# Patient Record
Sex: Male | Born: 1975 | Race: Black or African American | Hispanic: No | Marital: Single | Smoking: Never smoker
Health system: Southern US, Community
[De-identification: ages and names within clinical notes are randomized; demographics above are authoritative.]

---

## 2007-02-03 ENCOUNTER — Emergency Department (HOSPITAL_COMMUNITY): Admission: EM | Admit: 2007-02-03 | Discharge: 2007-02-03 | Payer: Self-pay | Admitting: Emergency Medicine

## 2008-04-25 ENCOUNTER — Emergency Department (HOSPITAL_COMMUNITY): Admission: EM | Admit: 2008-04-25 | Discharge: 2008-04-25 | Payer: Self-pay | Admitting: Family Medicine

## 2009-03-05 ENCOUNTER — Emergency Department (HOSPITAL_COMMUNITY): Admission: EM | Admit: 2009-03-05 | Discharge: 2009-03-05 | Payer: Self-pay | Admitting: Emergency Medicine

## 2009-03-05 ENCOUNTER — Emergency Department (HOSPITAL_COMMUNITY): Admission: EM | Admit: 2009-03-05 | Discharge: 2009-03-05 | Payer: Self-pay | Admitting: Family Medicine

## 2018-11-23 ENCOUNTER — Encounter (HOSPITAL_COMMUNITY): Payer: Self-pay | Admitting: Emergency Medicine

## 2018-11-23 ENCOUNTER — Other Ambulatory Visit: Payer: Self-pay

## 2018-11-23 ENCOUNTER — Emergency Department (HOSPITAL_COMMUNITY)
Admission: EM | Admit: 2018-11-23 | Discharge: 2018-11-23 | Disposition: A | Discharge status: Home / Self Care | Payer: Self-pay | Attending: Emergency Medicine | Admitting: Emergency Medicine

## 2018-11-23 DIAGNOSIS — R1110 Vomiting, unspecified: Secondary | ICD-10-CM | POA: Insufficient documentation

## 2018-11-23 DIAGNOSIS — G43809 Other migraine, not intractable, without status migrainosus: Secondary | ICD-10-CM | POA: Insufficient documentation

## 2018-11-23 LAB — URINALYSIS, ROUTINE W REFLEX MICROSCOPIC
BILIRUBIN URINE: NEGATIVE
Glucose, UA: NEGATIVE mg/dL
Hgb urine dipstick: NEGATIVE
Ketones, ur: NEGATIVE mg/dL
Leukocytes,Ua: NEGATIVE
NITRITE: NEGATIVE
Protein, ur: NEGATIVE mg/dL
SPECIFIC GRAVITY, URINE: 1.005 (ref 1.005–1.030)
pH: 6 (ref 5.0–8.0)

## 2018-11-23 LAB — COMPREHENSIVE METABOLIC PANEL
ALK PHOS: 52 U/L (ref 38–126)
ALT: 16 U/L (ref 0–44)
AST: 19 U/L (ref 15–41)
Albumin: 3.9 g/dL (ref 3.5–5.0)
Anion gap: 6 (ref 5–15)
BUN: 8 mg/dL (ref 6–20)
CO2: 26 mmol/L (ref 22–32)
Calcium: 8.7 mg/dL — ABNORMAL LOW (ref 8.9–10.3)
Chloride: 106 mmol/L (ref 98–111)
Creatinine, Ser: 0.98 mg/dL (ref 0.61–1.24)
GFR calc Af Amer: >60 mL/min (ref >60)
Glucose, Bld: 91 mg/dL (ref 70–99)
POTASSIUM: 3.6 mmol/L (ref 3.5–5.1)
Sodium: 138 mmol/L (ref 135–145)
TOTAL PROTEIN: 6.4 g/dL — AB (ref 6.5–8.1)
Total Bilirubin: 0.6 mg/dL (ref 0.3–1.2)

## 2018-11-23 LAB — CBC
HCT: 42 % (ref 39.0–52.0)
HEMOGLOBIN: 13.3 g/dL (ref 13.0–17.0)
MCH: 28.1 pg (ref 26.0–34.0)
MCHC: 31.7 g/dL (ref 30.0–36.0)
MCV: 88.8 fL (ref 80.0–100.0)
Platelets: 196 10*3/uL (ref 150–400)
RBC: 4.73 MIL/uL (ref 4.22–5.81)
RDW: 12.8 % (ref 11.5–15.5)
WBC: 2.7 10*3/uL — AB (ref 4.0–10.5)
nRBC: 0 % (ref 0.0–0.2)

## 2018-11-23 LAB — LIPASE, BLOOD: LIPASE: 60 U/L — AB (ref 11–51)

## 2018-11-23 MED ORDER — SODIUM CHLORIDE 0.9% FLUSH: 3.0000 mL | Freq: Once | INTRAVENOUS | Status: DC

## 2018-11-23 MED ORDER — KETOROLAC TROMETHAMINE 30 MG/ML IJ SOLN
30.0000 mg | Freq: Once | INTRAMUSCULAR | Status: DC
  Filled 2018-11-23: qty 1

## 2018-11-23 MED ORDER — SODIUM CHLORIDE 0.9 % IV BOLUS (SEPSIS): 1000.0000 mL | Freq: Once | INTRAVENOUS | Status: DC

## 2018-11-23 MED ORDER — ONDANSETRON 8 MG PO TBDP: 8.0000 mg | ORAL_TABLET | Freq: Three times a day (TID) | ORAL | 0 refills | Status: DC | PRN

## 2018-11-23 MED ORDER — NAPROXEN 500 MG PO TABS: 500.0000 mg | ORAL_TABLET | Freq: Two times a day (BID) | ORAL | 0 refills | Status: DC

## 2018-11-23 MED ORDER — SODIUM CHLORIDE 0.9 % IV SOLN: 1000.0000 mL | INTRAVENOUS | Status: DC

## 2018-11-23 MED ORDER — PROCHLORPERAZINE EDISYLATE 10 MG/2ML IJ SOLN
10.0000 mg | Freq: Once | INTRAMUSCULAR | Status: DC
  Filled 2018-11-23: qty 2

## 2018-11-23 NOTE — ED Triage Notes (Signed)
Pt reports headache and vomiting x 1 today. NAD noted. Pt asking for doctors note in triage.

## 2018-11-23 NOTE — ED Notes (Signed)
Patient refused IV fluids/meds. 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

## 2018-11-23 NOTE — ED Provider Notes (Signed)
[facility] EMERGENCY DEPARTMENT Provider Note   CSN: [id number] Arrival date & time: 11/23/18  1659    History   Chief Complaint Chief Complaint  Patient presents with  . Headache  . Emesis    HPI Stephen Harrison is a 43 y.o. male.     HPI Pt started having trouble with vomiting yesterday.  He vomited a few times since yesterday.  He also developed a diffuse headache.  No neck pain.  No sudden onset.  No fever.  No trouble with vision or speech.  No abdominal pain. Pt tried some ibuprofen without relief.  History reviewed. No pertinent past medical history.  There are no active problems to display for this patient.   History reviewed. No pertinent surgical history.      Home Medications    Prior to Admission medications   Medication Sig Start Date End Date Taking? Authorizing Provider  naproxen (NAPROSYN) 500 MG tablet Take 1 tablet (500 mg total) by mouth 2 (two) times daily. 11/23/18   Linwood Dibbles, MD  ondansetron (ZOFRAN ODT) 8 MG disintegrating tablet Take 1 tablet (8 mg total) by mouth every 8 (eight) hours as needed for nausea or vomiting. 11/23/18   Linwood Dibbles, MD    Family History No family history on file.  Social History Social History   Tobacco Use  . Smoking status: Never Smoker  . Smokeless tobacco: Never Used  Substance Use Topics  . Alcohol use: Never    Frequency: Never  . Drug use: Never     Allergies   Patient has no known allergies.   Review of Systems Review of Systems  All other systems reviewed and are negative.    Physical Exam Updated Vital Signs BP (!) 133/97 (BP Location: Right Arm)   Pulse 66   Temp 98.2 F (36.8 C) (Oral)   Resp 16   SpO2 98%   Physical Exam Vitals signs and nursing note reviewed.  Constitutional:      General: He is not in acute distress.    Appearance: He is well-developed.  HENT:     Head: Normocephalic and atraumatic.     Right Ear: External ear normal.     Left Ear:  External ear normal.  Eyes:     General: No scleral icterus.       Right eye: No discharge.        Left eye: No discharge.     Conjunctiva/sclera: Conjunctivae normal.  Neck:     Musculoskeletal: Normal range of motion and neck supple. No neck rigidity.     Trachea: No tracheal deviation.  Cardiovascular:     Rate and Rhythm: Normal rate and regular rhythm.  Pulmonary:     Effort: Pulmonary effort is normal. No respiratory distress.     Breath sounds: Normal breath sounds. No stridor. No wheezing or rales.  Abdominal:     General: Bowel sounds are normal. There is no distension.     Palpations: Abdomen is soft.     Tenderness: There is no abdominal tenderness. There is no guarding or rebound.  Musculoskeletal:        General: No tenderness.  Skin:    General: Skin is warm and dry.     Findings: No rash.  Neurological:     Mental Status: He is alert.     Cranial Nerves: No cranial nerve deficit (no facial droop, extraocular movements intact, no slurred speech).     Sensory: No sensory deficit.  Motor: No abnormal muscle tone or seizure activity.     Coordination: Coordination normal.      ED Treatments / Results  Labs (all labs ordered are listed, but only abnormal results are displayed) Labs Reviewed  LIPASE, BLOOD - Abnormal; Notable for the following components:      Result Value   Lipase 60 (*)    All other components within normal limits  COMPREHENSIVE METABOLIC PANEL - Abnormal; Notable for the following components:   Calcium 8.7 (*)    Total Protein 6.4 (*)    All other components within normal limits  CBC - Abnormal; Notable for the following components:   WBC 2.7 (*)    All other components within normal limits  URINALYSIS, ROUTINE W REFLEX MICROSCOPIC - Abnormal; Notable for the following components:   Color, Urine STRAW (*)    All other components within normal limits    EKG None  Radiology No results found.  Procedures Procedures (including  critical care time)  Medications Ordered in ED Medications  sodium chloride flush (NS) 0.9 % injection 3 mL (3 mLs Intravenous Refused 11/23/18 2040)  sodium chloride 0.9 % bolus 1,000 mL (1,000 mLs Intravenous Refused 11/23/18 2040)    Followed by  0.9 %  sodium chloride infusion (1,000 mLs Intravenous Refused 11/23/18 2041)  prochlorperazine (COMPAZINE) injection 10 mg (10 mg Intravenous Refused 11/23/18 2041)  ketorolac (TORADOL) 30 MG/ML injection 30 mg (30 mg Intravenous Refused 11/23/18 2041)     Initial Impression / Assessment and Plan / ED Course  I have reviewed the triage vital signs and the nursing notes.  Pertinent labs & imaging results that were available during my care of the patient were reviewed by me and considered in my medical decision making (see chart for details).   Patient describes gradual onset of headache associated with nausea and vomiting.  He appears well nontoxic.  No neck stiffness.  Symptoms are suggestive of a migraine headache.  I offered IV fluids and pain medications.  Patient was anxious to go home and did not want any treatment.  Plan on discharge home with prescription for Naprosyn and Zofran.  Final Clinical Impressions(s) / ED Diagnoses   Final diagnoses:  Other migraine without status migrainosus, not intractable    ED Discharge Orders         Ordered    naproxen (NAPROSYN) 500 MG tablet  2 times daily     11/23/18 2046    ondansetron (ZOFRAN ODT) 8 MG disintegrating tablet  Every 8 hours PRN     11/23/18 2046           Linwood Dibbles, MD 11/23/18 2048

## 2018-11-23 NOTE — Discharge Instructions (Addendum)
Take the medications as prescribed, follow-up with the primary care doctor if not improving, return as needed for worsening symptoms

## 2018-11-28 ENCOUNTER — Emergency Department (HOSPITAL_COMMUNITY): Payer: No Typology Code available for payment source

## 2018-11-28 ENCOUNTER — Emergency Department (HOSPITAL_COMMUNITY)
Admission: EM | Admit: 2018-11-28 | Discharge: 2018-11-28 | Disposition: A | Discharge status: Home / Self Care | Payer: No Typology Code available for payment source | Attending: Emergency Medicine | Admitting: Emergency Medicine

## 2018-11-28 ENCOUNTER — Other Ambulatory Visit: Payer: Self-pay

## 2018-11-28 ENCOUNTER — Encounter (HOSPITAL_COMMUNITY): Payer: Self-pay

## 2018-11-28 DIAGNOSIS — S8991XA Unspecified injury of right lower leg, initial encounter: Secondary | ICD-10-CM | POA: Diagnosis present

## 2018-11-28 DIAGNOSIS — Y92410 Unspecified street and highway as the place of occurrence of the external cause: Secondary | ICD-10-CM | POA: Diagnosis not present

## 2018-11-28 DIAGNOSIS — Y998 Other external cause status: Secondary | ICD-10-CM | POA: Insufficient documentation

## 2018-11-28 DIAGNOSIS — Y9389 Activity, other specified: Secondary | ICD-10-CM | POA: Insufficient documentation

## 2018-11-28 DIAGNOSIS — S8011XA Contusion of right lower leg, initial encounter: Secondary | ICD-10-CM | POA: Diagnosis not present

## 2018-11-28 MED ORDER — NAPROXEN 500 MG PO TABS: 500.0000 mg | ORAL_TABLET | Freq: Two times a day (BID) | ORAL | 0 refills | Status: DC

## 2018-11-28 MED ORDER — CYCLOBENZAPRINE HCL 5 MG PO TABS: 5.0000 mg | ORAL_TABLET | Freq: Two times a day (BID) | ORAL | 0 refills | Status: DC | PRN

## 2018-11-28 MED ORDER — IBUPROFEN 200 MG PO TABS
600.0000 mg | ORAL_TABLET | Freq: Once | ORAL | Status: AC
Start: 2018-11-28 — End: 2018-11-28
  Administered 2018-11-28: 600 mg via ORAL
  Filled 2018-11-28: qty 1

## 2018-11-28 NOTE — ED Notes (Signed)
Patient verbalizes understanding of discharge instructions. Opportunity for questioning and answers were provided. Armband removed by staff, pt discharged from ED.  

## 2018-11-28 NOTE — Discharge Instructions (Addendum)
Your x-rays show no fracture or dislocation. Do not drive while taking the muscle relaxer as it will make you sleepy. Follow up with your doctor or return here for worsening symptoms.

## 2018-11-28 NOTE — ED Notes (Signed)
Patient transported to X-ray 
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
## 2018-11-28 NOTE — ED Notes (Signed)
Pt back from X-ray.  
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
## 2019-04-15 ENCOUNTER — Other Ambulatory Visit: Payer: Self-pay

## 2019-04-15 ENCOUNTER — Encounter (HOSPITAL_COMMUNITY): Payer: Self-pay | Admitting: Emergency Medicine

## 2019-04-15 ENCOUNTER — Emergency Department (HOSPITAL_COMMUNITY)
Admission: EM | Admit: 2019-04-15 | Discharge: 2019-04-15 | Disposition: A | Discharge status: Home / Self Care | Payer: Self-pay | Attending: Emergency Medicine | Admitting: Emergency Medicine

## 2019-04-15 DIAGNOSIS — X118XXA Contact with other hot tap-water, initial encounter: Secondary | ICD-10-CM | POA: Insufficient documentation

## 2019-04-15 DIAGNOSIS — Y9389 Activity, other specified: Secondary | ICD-10-CM | POA: Insufficient documentation

## 2019-04-15 DIAGNOSIS — T24232A Burn of second degree of left lower leg, initial encounter: Secondary | ICD-10-CM | POA: Insufficient documentation

## 2019-04-15 DIAGNOSIS — Z23 Encounter for immunization: Secondary | ICD-10-CM | POA: Insufficient documentation

## 2019-04-15 DIAGNOSIS — Y990 Civilian activity done for income or pay: Secondary | ICD-10-CM | POA: Insufficient documentation

## 2019-04-15 DIAGNOSIS — Y9289 Other specified places as the place of occurrence of the external cause: Secondary | ICD-10-CM | POA: Insufficient documentation

## 2019-04-15 MED ORDER — BACITRACIN ZINC 500 UNIT/GM EX OINT: 1.0000 "application " | TOPICAL_OINTMENT | Freq: Two times a day (BID) | CUTANEOUS | 0 refills | Status: DC

## 2019-04-15 MED ORDER — BACITRACIN ZINC 500 UNIT/GM EX OINT
TOPICAL_OINTMENT | Freq: Two times a day (BID) | CUTANEOUS | Status: DC
  Filled 2019-04-15: qty 4.5

## 2019-04-15 MED ORDER — TETANUS-DIPHTH-ACELL PERTUSSIS 5-2.5-18.5 LF-MCG/0.5 IM SUSP
0.5000 mL | Freq: Once | INTRAMUSCULAR | Status: AC
  Administered 2019-04-15: 0.5 mL via INTRAMUSCULAR
  Filled 2019-04-15: qty 0.5

## 2019-04-15 MED ORDER — CEPHALEXIN 500 MG PO CAPS: 500.0000 mg | ORAL_CAPSULE | Freq: Four times a day (QID) | ORAL | 0 refills | Status: AC
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
## 2019-04-15 NOTE — Discharge Instructions (Signed)
Clean wound with mild soap. Apply antibiotic ointment twice daily. Take Keflex as prescribed. Recheck with employee health in 2 days.
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
## 2019-04-15 NOTE — ED Notes (Signed)
Laura (PA) at bedside.

## 2019-04-18 ENCOUNTER — Emergency Department (HOSPITAL_COMMUNITY)
Admission: EM | Admit: 2019-04-18 | Discharge: 2019-04-18 | Disposition: A | Discharge status: Home / Self Care | Payer: Self-pay | Attending: Emergency Medicine | Admitting: Emergency Medicine

## 2019-04-18 ENCOUNTER — Other Ambulatory Visit: Payer: Self-pay

## 2019-04-18 DIAGNOSIS — M7989 Other specified soft tissue disorders: Secondary | ICD-10-CM

## 2019-04-18 DIAGNOSIS — L03116 Cellulitis of left lower limb: Secondary | ICD-10-CM | POA: Insufficient documentation

## 2019-04-18 DIAGNOSIS — R2242 Localized swelling, mass and lump, left lower limb: Secondary | ICD-10-CM | POA: Insufficient documentation

## 2019-04-18 MED ORDER — DOXYCYCLINE HYCLATE 100 MG PO CAPS: 100.0000 mg | ORAL_CAPSULE | Freq: Two times a day (BID) | ORAL | 0 refills | Status: DC

## 2019-04-18 MED ORDER — DOXYCYCLINE HYCLATE 100 MG PO TABS
100.0000 mg | ORAL_TABLET | Freq: Once | ORAL | Status: AC
  Administered 2019-04-18: 100 mg via ORAL
  Filled 2019-04-18: qty 1
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
## 2019-04-18 NOTE — Discharge Instructions (Signed)
Continue taking Keflex as prescribed Start Doxycycline twice daily for one week Keep wound clean and dry Please return if worsening
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
## 2019-04-20 ENCOUNTER — Other Ambulatory Visit: Payer: Self-pay

## 2019-04-20 ENCOUNTER — Ambulatory Visit (INDEPENDENT_AMBULATORY_CARE_PROVIDER_SITE_OTHER): Payer: Self-pay

## 2019-04-20 ENCOUNTER — Encounter (HOSPITAL_COMMUNITY): Payer: Self-pay

## 2019-04-20 ENCOUNTER — Ambulatory Visit (HOSPITAL_COMMUNITY)
Admission: EM | Admit: 2019-04-20 | Discharge: 2019-04-20 | Disposition: A | Discharge status: Home / Self Care | Payer: Self-pay | Attending: Family Medicine | Admitting: Family Medicine

## 2019-04-20 DIAGNOSIS — T300 Burn of unspecified body region, unspecified degree: Secondary | ICD-10-CM

## 2019-04-20 MED ORDER — SILVER SULFADIAZINE 1 % EX CREA
TOPICAL_CREAM | Freq: Every day | CUTANEOUS | Status: DC
  Administered 2019-04-20: 15:00:00 via TOPICAL

## 2019-04-20 MED ORDER — SILVER SULFADIAZINE 1 % EX CREA: 1.0000 "application " | TOPICAL_CREAM | Freq: Every day | CUTANEOUS | 0 refills | Status: DC

## 2019-04-20 MED ORDER — SILVER SULFADIAZINE 1 % EX CREA
TOPICAL_CREAM | CUTANEOUS | Status: AC
  Filled 2019-04-20: qty 85

## 2019-04-20 MED ORDER — TRAMADOL HCL 50 MG PO TABS: 50.0000 mg | ORAL_TABLET | Freq: Four times a day (QID) | ORAL | 0 refills | Status: DC | PRN
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
## 2019-04-20 NOTE — Discharge Instructions (Addendum)
Your x-ray looked okay.  We are going to place some Silvadene cream and Ace wrap to your ankle You can use the Silvadene cream once a day with nonadherent dressing, keep covered  Ace wrap for swelling Make sure you are elevating the foot Tramadol for pain Follow up as needed for continued or worsening symptoms
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
## 2019-04-21 LAB — AEROBIC CULTURE W GRAM STAIN (SUPERFICIAL SPECIMEN)

## 2019-04-21 LAB — AEROBIC CULTURE? (SUPERFICIAL SPECIMEN)
Culture: NO GROWTH
Gram Stain: NONE SEEN

## 2019-04-24 ENCOUNTER — Ambulatory Visit (HOSPITAL_COMMUNITY)
Admission: EM | Admit: 2019-04-24 | Discharge: 2019-04-24 | Disposition: A | Discharge status: Home / Self Care | Payer: Self-pay | Attending: Emergency Medicine | Admitting: Emergency Medicine

## 2019-04-24 ENCOUNTER — Encounter (HOSPITAL_COMMUNITY): Payer: Self-pay

## 2019-04-24 DIAGNOSIS — T24232D Burn of second degree of left lower leg, subsequent encounter: Secondary | ICD-10-CM

## 2019-04-24 MED ORDER — SILVER SULFADIAZINE 1 % EX CREA
TOPICAL_CREAM | CUTANEOUS | Status: AC
  Filled 2019-04-24: qty 85

## 2019-04-24 MED ORDER — DOXYCYCLINE HYCLATE 100 MG PO CAPS: 100.0000 mg | ORAL_CAPSULE | Freq: Two times a day (BID) | ORAL | 0 refills | Status: DC
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
## 2019-04-24 NOTE — Discharge Instructions (Signed)
Clean your wound twice a day and apply the Silvadene cream generously.  Then apply a bandage.    Keep your foot and leg elevated as you are able.    Take the antibiotic twice a day for 10 days.    Return here or follow-up with the primary care provider listed for a recheck of your wound in 3 to 5 days.  Return sooner if you develop pus-like drainage, worsening pain, fever, red streaks, or other signs of infection.

## 2019-04-27 ENCOUNTER — Encounter (HOSPITAL_COMMUNITY): Payer: Self-pay

## 2019-04-27 ENCOUNTER — Other Ambulatory Visit: Payer: Self-pay

## 2019-04-27 ENCOUNTER — Ambulatory Visit (HOSPITAL_COMMUNITY)
Admission: EM | Admit: 2019-04-27 | Discharge: 2019-04-27 | Disposition: A | Discharge status: Home / Self Care | Payer: Self-pay | Attending: Family Medicine | Admitting: Family Medicine

## 2019-04-27 DIAGNOSIS — T24232D Burn of second degree of left lower leg, subsequent encounter: Secondary | ICD-10-CM

## 2019-04-27 NOTE — Discharge Instructions (Signed)
Continue to take the antibiotic doxycycline as prescribed.    Continue to keep your wound clean twice a day, apply the Silvadene cream, and a dressing.  Leave your wound open to the air when you are able to not touch it on anything  but keep it covered while you are wearing your boots/shoes.    Return here if you develop increased redness, swelling, drainage from the wound, fever, chills, or other symptoms.
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
## 2019-05-16 ENCOUNTER — Ambulatory Visit (HOSPITAL_COMMUNITY)
Admission: EM | Admit: 2019-05-16 | Discharge: 2019-05-16 | Disposition: A | Discharge status: Home / Self Care | Payer: Self-pay | Attending: Family Medicine | Admitting: Family Medicine

## 2019-05-16 ENCOUNTER — Other Ambulatory Visit: Payer: Self-pay

## 2019-05-16 ENCOUNTER — Encounter (HOSPITAL_COMMUNITY): Payer: Self-pay

## 2019-05-16 DIAGNOSIS — M79605 Pain in left leg: Secondary | ICD-10-CM

## 2019-05-16 DIAGNOSIS — T24232D Burn of second degree of left lower leg, subsequent encounter: Secondary | ICD-10-CM

## 2019-05-16 NOTE — Discharge Instructions (Signed)
Keep up the good work  Please keep applying the medicine
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
## 2019-05-16 NOTE — ED Triage Notes (Signed)
Pt states he's back for a wound check on his left ankle.

## 2019-05-27 ENCOUNTER — Other Ambulatory Visit: Payer: Self-pay

## 2019-05-27 ENCOUNTER — Encounter (HOSPITAL_COMMUNITY): Payer: Self-pay | Admitting: Emergency Medicine

## 2019-05-27 ENCOUNTER — Ambulatory Visit (HOSPITAL_COMMUNITY)
Admission: EM | Admit: 2019-05-27 | Discharge: 2019-05-27 | Disposition: A | Discharge status: Home / Self Care | Payer: Self-pay | Attending: Family Medicine | Admitting: Family Medicine

## 2019-05-27 DIAGNOSIS — T24232D Burn of second degree of left lower leg, subsequent encounter: Secondary | ICD-10-CM

## 2019-05-27 MED ORDER — SILVER SULFADIAZINE 1 % EX CREA: 1.0000 "application " | TOPICAL_CREAM | Freq: Every day | CUTANEOUS | 1 refills | Status: DC
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
## 2019-05-27 NOTE — Discharge Instructions (Addendum)
Please follow up if your symptoms change
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
## 2019-11-20 ENCOUNTER — Emergency Department (HOSPITAL_COMMUNITY)
Admission: EM | Admit: 2019-11-20 | Discharge: 2019-11-20 | Disposition: A | Discharge status: Home / Self Care | Payer: Self-pay | Attending: Emergency Medicine | Admitting: Emergency Medicine

## 2019-11-20 ENCOUNTER — Other Ambulatory Visit: Payer: Self-pay

## 2019-11-20 ENCOUNTER — Encounter (HOSPITAL_COMMUNITY): Payer: Self-pay | Admitting: Emergency Medicine

## 2019-11-20 DIAGNOSIS — L249 Irritant contact dermatitis, unspecified cause: Secondary | ICD-10-CM | POA: Insufficient documentation

## 2019-11-20 MED ORDER — HYDROCORTISONE 1 % EX CREA: TOPICAL_CREAM | CUTANEOUS | 0 refills | Status: DC

## 2019-11-20 NOTE — Discharge Instructions (Signed)
Please apply the hydrocortisone cream to affected area, as directed.  Alternate warm and cool compresses to the affected areas to help facilitate expulsion of any remaining fiberglass particles.  Return to the ED or seek immediate medical attention for any new or worsening symptoms.
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
## 2019-11-20 NOTE — ED Provider Notes (Signed)
[facility] EMERGENCY DEPARTMENT Provider Note   CSN: [id number] Arrival date & time: 11/20/19  1213     History Chief Complaint  Patient presents with  . Rash    Stephen Harrison is a 44 y.o. male with no significant PMH who presents to the ED with evidence of fiberglass induced rash.  Patient reports that he works in [employer] and coworkers on [employer] of a [employer] worker comprised of fiberglass particles off of the roof which descended upon him and a couple other coworkers.  After he returned home, he developed significant pruritus in the crease of his elbow as well as his left lower abdominal region.  There is no significant overlying skin changes over his abdomen, however he has redness and a "sparkly" appearance in the crease of his right elbow.  He has taken numerous showers and has scrubbed with soap and water, but continues to endorse itching and wanted to come to the ED for evaluation.  He denies any diminished range of motion, fevers or chills, significant pain symptoms, difficulty breathing, shortness of breath, sore throat, wheezing, or any other symptoms.  He does not believe he inhaled the fiberglass particles.  HPI     History reviewed. No pertinent past medical history.  There are no problems to display for this patient.   History reviewed. No pertinent surgical history.     Family History  Problem Relation Age of Onset  . Cancer Mother   . Alcohol abuse Father     Social History   Tobacco Use  . Smoking status: Never Smoker  . Smokeless tobacco: Never Used  Substance Use Topics  . Alcohol use: Never  . Drug use: Never    Home Medications Prior to Admission medications   Medication Sig Start Date End Date Taking? Authorizing Provider  aspirin EC 81 MG tablet Take 162-324 mg by mouth as needed (for headaches or mild pain).    [provider]  bacitracin ointment Apply 1 application topically 2 (two) times daily.  04/15/19   Jeannie Fend, PA-C  doxycycline (VIBRAMYCIN) 100 MG capsule Take 1 capsule (100 mg total) by mouth 2 (two) times daily. 04/24/19   Mickie Bail, NP  hydrocortisone cream 1 % Apply to affected area 2 times daily 11/20/19   Lorelee New, PA-C  silver sulfADIAZINE (SILVADENE) 1 % cream Apply 1 application topically daily. 05/27/19   Myra Rude, MD  traMADol (ULTRAM) 50 MG tablet Take 1 tablet (50 mg total) by mouth every 6 (six) hours as needed. 04/20/19   Janace Aris, NP    Allergies    Patient has no known allergies.  Review of Systems   Review of Systems  Constitutional: Negative for chills and fever.  Respiratory: Negative for cough, shortness of breath and wheezing.   Skin: Positive for color change and rash. Negative for wound.  Neurological: Negative for weakness and numbness.    Physical Exam Updated Vital Signs BP 123/86 (BP Location: Left Arm)   Pulse 74   Temp 98.1 F (36.7 C) (Oral)   Resp 18   SpO2 98%   Physical Exam Vitals and nursing note reviewed. Exam conducted with a chaperone present.  Constitutional:      General: He is not in acute distress.    Appearance: Normal appearance. He is not ill-appearing.  HENT:     Head: Normocephalic and atraumatic.  Eyes:     General: No scleral icterus.  Conjunctiva/sclera: Conjunctivae normal.  Cardiovascular:     Rate and Rhythm: Normal rate and regular rhythm.     Pulses: Normal pulses.     Heart sounds: Normal heart sounds.  Pulmonary:     Effort: Pulmonary effort is normal. No respiratory distress.     Breath sounds: Normal breath sounds. No wheezing or rales.  Musculoskeletal:     Cervical back: Normal range of motion. No rigidity.  Skin:    Comments: Erythematous rash in right elbow, specifically in antecubital fossa.  Shimmery appearance.  No significant rash elsewhere.  Neurological:     General: No focal deficit present.     Mental Status: He is alert and oriented to person, place,  and time.     GCS: GCS eye subscore is 4. GCS verbal subscore is 5. GCS motor subscore is 6.     Sensory: No sensory deficit.  Psychiatric:        Mood and Affect: Mood normal.        Behavior: Behavior normal.        Thought Content: Thought content normal.     ED Results / Procedures / Treatments   Labs (all labs ordered are listed, but only abnormal results are displayed) Labs Reviewed - No data to display  EKG None  Radiology No results found.  Procedures Procedures (including critical care time)  Medications Ordered in ED Medications - No data to display  ED Course  I have reviewed the triage vital signs and the nursing notes.  Pertinent labs & imaging results that were available during my care of the patient were reviewed by me and considered in my medical decision making (see chart for details).    MDM Rules/Calculators/A&P                      Patient's history and physical exam is consistent with fiberglass dermatitis.  Applied tape in the ER and attempted to remove any large fiberglass particles.  Difficult to visualize and definitively assess whether or not this approach was successful.  Encouraging regular warm washes with soap.  Also encouraging alternating hot and cold application to the affected area to help facilitate expulsion of the remaining fiberglass particles.  Given his significant pruritus, will prescribe topical corticosteroids.  Recommending follow-up with primary care provider.  ED return precautions discussed.   Final Clinical Impression(s) / ED Diagnoses Final diagnoses:  Irritant contact dermatitis, unspecified trigger    Rx / DC Orders ED Discharge Orders         Ordered    hydrocortisone cream 1 %     11/20/19 1605           Reita Chard 11/20/19 1605    Little, Wenda Overland, MD 11/21/19 [phone]
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
## 2021-06-23 ENCOUNTER — Emergency Department (HOSPITAL_COMMUNITY)
Admission: EM | Admit: 2021-06-23 | Discharge: 2021-06-23 | Disposition: A | Discharge status: Home / Self Care | Payer: Self-pay | Attending: Emergency Medicine | Admitting: Emergency Medicine

## 2021-06-23 ENCOUNTER — Other Ambulatory Visit: Payer: Self-pay

## 2021-06-23 DIAGNOSIS — Z20822 Contact with and (suspected) exposure to covid-19: Secondary | ICD-10-CM | POA: Insufficient documentation

## 2021-06-23 DIAGNOSIS — J069 Acute upper respiratory infection, unspecified: Secondary | ICD-10-CM | POA: Insufficient documentation

## 2021-06-23 LAB — RESP PANEL BY RT-PCR (FLU A&B, COVID) ARPGX2
Influenza A by PCR: NEGATIVE
Influenza B by PCR: NEGATIVE
SARS Coronavirus 2 by RT PCR: NEGATIVE

## 2021-06-23 LAB — GROUP A STREP BY PCR: Group A Strep by PCR: NOT DETECTED

## 2021-06-23 MED ORDER — NAPROXEN 500 MG PO TABS: 500.0000 mg | ORAL_TABLET | Freq: Two times a day (BID) | ORAL | 0 refills | Status: DC

## 2021-06-23 MED ORDER — BENZONATATE 100 MG PO CAPS: 100.0000 mg | ORAL_CAPSULE | Freq: Three times a day (TID) | ORAL | 0 refills | Status: DC

## 2021-06-23 NOTE — ED Provider Notes (Signed)
[facility]-EMERGENCY DEPT Provider Note   CSN: [id number] Arrival date & time: 06/23/21  1500     History Chief Complaint  Patient presents with   Sore Throat    Stephen Harrison is a 45 y.o. male here for evaluation of sore throat and cough.  Began 2 to 3 days ago.  Cough is nonproductive.  Describes sore throat is scratchy.  He is able to tolerate p.o. intake at home without difficulty.  No headache, fever, chills, congestion, rhinorrhea, neck pain, pain with swallowing, chest pain, shortness breath abdominal pain, diarrhea or dysuria. No GERD Sx. No known sick contacts.  Denies additional aggravating or relieving factors.  History obtained from patient and past medical records.  No interpreter used.  HPI     No past medical history on file.  There are no problems to display for this patient.   No past surgical history on file.     Family History  Problem Relation Age of Onset   Cancer Mother    Alcohol abuse Father     Social History   Tobacco Use   Smoking status: Never   Smokeless tobacco: Never  Vaping Use   Vaping Use: Never used  Substance Use Topics   Alcohol use: Never   Drug use: Never    Home Medications Prior to Admission medications   Medication Sig Start Date End Date Taking? Authorizing Provider  benzonatate (TESSALON) 100 MG capsule Take 1 capsule (100 mg total) by mouth every 8 (eight) hours. 06/23/21  Yes Shiree Altemus A, PA-C  naproxen (NAPROSYN) 500 MG tablet Take 1 tablet (500 mg total) by mouth 2 (two) times daily. 06/23/21  Yes Sun Wilensky A, PA-C  aspirin EC 81 MG tablet Take 162-324 mg by mouth as needed (for headaches or mild pain).    [provider]  bacitracin ointment Apply 1 application topically 2 (two) times daily. 04/15/19   Jeannie Fend, PA-C  doxycycline (VIBRAMYCIN) 100 MG capsule Take 1 capsule (100 mg total) by mouth 2 (two) times daily. 04/24/19   Mickie Bail, NP  hydrocortisone  cream 1 % Apply to affected area 2 times daily 11/20/19   Lorelee New, PA-C  silver sulfADIAZINE (SILVADENE) 1 % cream Apply 1 application topically daily. 05/27/19   Myra Rude, MD  traMADol (ULTRAM) 50 MG tablet Take 1 tablet (50 mg total) by mouth every 6 (six) hours as needed. 04/20/19   Janace Aris, NP    Allergies    Patient has no known allergies.  Review of Systems   Review of Systems  Constitutional: Negative.   HENT:  Positive for sore throat. Negative for congestion, dental problem, drooling, ear discharge, ear pain, facial swelling, hearing loss, nosebleeds, postnasal drip, rhinorrhea, sinus pressure, sinus pain, sneezing, trouble swallowing and voice change.   Respiratory:  Positive for cough. Negative for apnea, choking, chest tightness, shortness of breath, wheezing and stridor.   Cardiovascular: Negative.   Gastrointestinal: Negative.   Genitourinary: Negative.   Musculoskeletal: Negative.   Skin: Negative.   Neurological: Negative.   All other systems reviewed and are negative.  Physical Exam Updated Vital Signs BP (!) 138/91 (BP Location: Right Arm)   Pulse 95   Temp 98.6 F (37 C) (Oral)   Resp 18   Ht 5\' 9"  (1.753 m)   Wt 81.6 kg   SpO2 98%   BMI 26.58 kg/m   Physical Exam Vitals and nursing note reviewed.  Constitutional:  General: He is not in acute distress.    Appearance: He is well-developed. He is not ill-appearing, toxic-appearing or diaphoretic.  HENT:     Head: Normocephalic and atraumatic.     Right Ear: Tympanic membrane and ear canal normal.     Left Ear: Tympanic membrane and ear canal normal.     Nose: No congestion or rhinorrhea.     Mouth/Throat:     Comments: Overall difficult exam due to patient with gag reflex however no gross evidence of tonsillar edema, uvula deviation with what I can see.  He has no pooling of secretions.  No drooling, dysphagia or trismus. No obvious PTA or RPA Eyes:     Pupils: Pupils are equal,  round, and reactive to light.  Neck:     Comments: Full range of motion Cardiovascular:     Rate and Rhythm: Normal rate and regular rhythm.     Heart sounds: Normal heart sounds.  Pulmonary:     Effort: Pulmonary effort is normal. No respiratory distress.     Breath sounds: Normal breath sounds.  Abdominal:     General: Bowel sounds are normal. There is no distension.     Palpations: Abdomen is soft.  Musculoskeletal:        General: Normal range of motion.     Cervical back: Normal range of motion and neck supple.  Skin:    General: Skin is warm and dry.     Capillary Refill: Capillary refill takes less than 2 seconds.  Neurological:     General: No focal deficit present.     Mental Status: He is alert and oriented to person, place, and time.    ED Results / Procedures / Treatments   Labs (all labs ordered are listed, but only abnormal results are displayed) Labs Reviewed  GROUP A STREP BY PCR  RESP PANEL BY RT-PCR (FLU A&B, COVID) ARPGX2    EKG None  Radiology No results found.  Procedures Procedures   Medications Ordered in ED Medications - No data to display  ED Course  I have reviewed the triage vital signs and the nursing notes.  Pertinent labs & imaging results that were available during my care of the patient were reviewed by me and considered in my medical decision making (see chart for details).  Here for evaluation of scratchy throat and cough.  He is afebrile, no gross evidence of PTA, RPA, p.o. clear, no pooling of secretions.  Heart and lungs clear. No neck stiffness or neck rigidity. No meningismus. He is tolerating p.o. intake without difficulty. No CP, SOB. PERC neg. No GERD Sx.  Strep neg COVID/ Flu neg  Patient reassessed.  Discussed results.  Apears overall well.  DC home with symptomatic management. Likely viral URI. Return for any worsening symptoms.  The patient has been appropriately medically screened and/or stabilized in the ED. I have  low suspicion for any other emergent medical condition which would require further screening, evaluation or treatment in the ED or require inpatient management.  Patient is hemodynamically stable and in no acute distress.  Patient able to ambulate in department prior to ED.  Evaluation does not show acute pathology that would require ongoing or additional emergent interventions while in the emergency department or further inpatient treatment.  I have discussed the diagnosis with the patient and answered all questions.  Pain is been managed while in the emergency department and patient has no further complaints prior to discharge.  Patient is comfortable with  plan discussed in room and is stable for discharge at this time.  I have discussed strict return precautions for returning to the emergency department.  Patient was encouraged to follow-up with PCP/specialist refer to at discharge.     MDM Rules/Calculators/A&P                           Stephen Harrison was evaluated in Emergency Department on 06/23/2021 for the symptoms described in the history of present illness. He was evaluated in the context of the global COVID-19 pandemic, which necessitated consideration that the patient might be at risk for infection with the SARS-CoV-2 virus that causes COVID-19. Institutional protocols and algorithms that pertain to the evaluation of patients at risk for COVID-19 are in a state of rapid change based on information released by regulatory bodies including the CDC and federal and state organizations. These policies and algorithms were followed during the patient's care in the ED.  Final Clinical Impression(s) / ED Diagnoses Final diagnoses:  Viral URI    Rx / DC Orders ED Discharge Orders          Ordered    benzonatate (TESSALON) 100 MG capsule  Every 8 hours        06/23/21 1717    naproxen (NAPROSYN) 500 MG tablet  2 times daily        06/23/21 1717             Hawk Mones A,  PA-C 06/23/21 1723    Gloris Manchester, MD 06/24/21 1235

## 2021-06-23 NOTE — ED Triage Notes (Signed)
Pt has had sore throat with swallowing and dry cough for several days.  Has not tried any OTC remedies.

## 2021-06-23 NOTE — Discharge Instructions (Signed)
Strep test, COVID, flu was negative.  This is likely a viral illness.  I have written you for a few medications to help with your symptoms.  Return for new or worsening symptoms.

## 2022-05-25 ENCOUNTER — Emergency Department (HOSPITAL_COMMUNITY)
Admission: EM | Admit: 2022-05-25 | Discharge: 2022-05-25 | Disposition: A | Discharge status: Home / Self Care | Payer: 59 | Attending: Emergency Medicine | Admitting: Emergency Medicine

## 2022-05-25 ENCOUNTER — Encounter (HOSPITAL_COMMUNITY): Payer: Self-pay

## 2022-05-25 ENCOUNTER — Other Ambulatory Visit: Payer: Self-pay

## 2022-05-25 DIAGNOSIS — J029 Acute pharyngitis, unspecified: Secondary | ICD-10-CM | POA: Diagnosis not present

## 2022-05-25 DIAGNOSIS — Z20822 Contact with and (suspected) exposure to covid-19: Secondary | ICD-10-CM | POA: Insufficient documentation

## 2022-05-25 DIAGNOSIS — R1310 Dysphagia, unspecified: Secondary | ICD-10-CM | POA: Diagnosis present

## 2022-05-25 LAB — RESP PANEL BY RT-PCR (FLU A&B, COVID) ARPGX2
Influenza A by PCR: NEGATIVE
Influenza B by PCR: NEGATIVE
SARS Coronavirus 2 by RT PCR: NEGATIVE

## 2022-05-25 LAB — GROUP A STREP BY PCR: Group A Strep by PCR: NOT DETECTED

## 2022-05-25 NOTE — ED Provider Triage Note (Signed)
Emergency Medicine Provider Triage Evaluation Note  Stephen Harrison , a 46 y.o. male  was evaluated in triage.  Pt complains of sore throat.  He feels like he cannot swallow, there is a burning sensation whenever he does follow.  He says he feels like he cannot swallow because of the pain.  He was not eating anything, no medicine, no history of angioedema.  Denies any lateralized weakness or numbness.  Not short of breath, no history of endoscopy previously..  Review of Systems  Per HPI  Physical Exam  BP (!) 136/94 (BP Location: Left Arm)   Pulse 76   Temp 98.4 F (36.9 C) (Oral)   Resp 16   Ht 5\' 8"  (1.727 m)   Wt 90.7 kg   SpO2 97%   BMI 30.41 kg/m  Gen:   Awake, no distress  = Resp:  Normal effort MSK:   Moves extremities without difficulty  Other:  Lungs are clear to auscultation, no stridor.  Uvula is midline no real erythema to the posterior oropharynx, normal phonation and handling secretions.  Nerves II through XII are grossly intact, grip strength symmetric bilaterally lower extremity strength symmetric bilaterally.  Medical Decision Making  Medically screening exam initiated at 3:30 PM.  Appropriate orders placed.  Stephen Harrison was informed that the remainder of the evaluation will be completed by another provider, this initial triage assessment does not replace that evaluation, and the importance of remaining in the ED until their evaluation is complete.     Sherrill Raring, PA-C 05/25/22 1531

## 2022-05-25 NOTE — ED Triage Notes (Signed)
Patient states he had a sudden episode where he felt like he was unable to swallow. Patient is able to swallow his own saliva and is speaking in complete sentences.  Patient does say that his throat is a "little sore."

## 2022-05-25 NOTE — Discharge Instructions (Signed)
Your test results were negative for strep, COVID-19, flu AMB.   Take Tylenol, ibuprofen as needed for pain. Increase hydration and rest.

## 2022-05-25 NOTE — ED Provider Notes (Signed)
[facility]-EMERGENCY DEPT Provider Note   CSN: [id number] Arrival date & time: 05/25/22  1511     History  Chief Complaint  Patient presents with   Dysphagia    Stephen Harrison is a 46 y.o. male.  46 year old male with no significant history presented to emergency department with complaints of sore throat. Patient reports he woke up this morning with pain and burning sensation at the back of the throat, postnasal drainage.  Patient denies difficulty swallowing or breathing. He denies fever, cough, shortness of breath nasal congestion, loss of taste or smell.  He denies sick contact.  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 Vital signs stable.  Patient is afebrile.  Patient is comfortable communicating with complete sentences.  No acute physical distress.  The history is provided by the patient. No language interpreter was used.       Home Medications Prior to Admission medications   Medication Sig Start Date End Date Taking? Authorizing Provider  aspirin EC 81 MG tablet Take 162-324 mg by mouth as needed (for headaches or mild pain).    [provider]  bacitracin ointment Apply 1 application topically 2 (two) times daily. 04/15/19   Jeannie Fend, PA-C  benzonatate (TESSALON) 100 MG capsule Take 1 capsule (100 mg total) by mouth every 8 (eight) hours. 06/23/21   Henderly, Britni A, PA-C  doxycycline (VIBRAMYCIN) 100 MG capsule Take 1 capsule (100 mg total) by mouth 2 (two) times daily. 04/24/19   Mickie Bail, NP  hydrocortisone cream 1 % Apply to affected area 2 times daily 11/20/19   Lorelee New, PA-C  naproxen (NAPROSYN) 500 MG tablet Take 1 tablet (500 mg total) by mouth 2 (two) times daily. 06/23/21   Henderly, Britni A, PA-C  silver sulfADIAZINE (SILVADENE) 1 % cream Apply 1 application topically daily. 05/27/19    Myra Rude, MD  traMADol (ULTRAM) 50 MG tablet Take 1 tablet (50 mg total) by mouth every 6 (six) hours as needed. 04/20/19   Janace Aris, NP      Allergies    Patient has no known allergies.    Review of Systems   Review of Systems  HENT:  Positive for postnasal drip and sore throat.     Physical Exam Updated Vital Signs BP (!) 136/94 (BP Location: Left Arm)   Pulse 76   Temp 98.4 F (36.9 C) (Oral)   Resp 16   Ht 5\' 8"  (1.727 m)   Wt 90.7 kg   SpO2 97%   BMI 30.41 kg/m  Physical Exam Vitals and nursing note reviewed.  Constitutional:      General: He is not in acute distress.    Appearance: Normal appearance. He is not ill-appearing.  HENT:     Head:     Jaw: No trismus.     Nose: No congestion or rhinorrhea.     Mouth/Throat:     Mouth: Mucous membranes are moist.     Pharynx: Pharyngeal swelling and posterior oropharyngeal erythema (Injected pharynx with erythematous lesions noted to the pharyngeal wall.  Uvula midline.  No tonsillar exudates appreciated.) present. No oropharyngeal exudate or uvula swelling.     Tonsils: No tonsillar exudate or tonsillar abscesses.  Eyes:     Conjunctiva/sclera: Conjunctivae normal.  Cardiovascular:     Rate and Rhythm: Normal rate and  regular rhythm.  Pulmonary:     Effort: Pulmonary effort is normal.     Breath sounds: Normal breath sounds.  Musculoskeletal:     Cervical back: Normal range of motion and neck supple. No rigidity or tenderness.  Neurological:     Mental Status: He is alert.  Psychiatric:        Mood and Affect: Mood normal.     ED Results / Procedures / Treatments   Labs (all labs ordered are listed, but only abnormal results are displayed) Labs Reviewed  GROUP A STREP BY PCR  RESP PANEL BY RT-PCR (FLU A&B, COVID) ARPGX2    EKG None  Radiology No results found.  Procedures Procedures    Medications Ordered in ED Medications - No data to display  ED Course/ Medical Decision  Making/ A&P                           Medical Decision Making 46 year old male with no significant medical history presented to emergency department with complaints of sore throat. Patient reporting he woke up with sore throat this morning.  He denies fever, cough, nasal congestion, chills shortness of breath.  Patient denies difficulty swallowing.  He denies sick contact.  Patient is comfortably communicating with complete sentences.  No acute visible distress. His vital signs are stable.  Patient mains afebrile. Physical exam: Pharyngeal erythema with erythematous lesions.  No pharyngeal exudates noted.  Uvula midline.  No cervical adenopathy.  Airway patent.  Lungs clear to auscultation.  S1S2 normal.  Differential diagnosis include strep pharyngitis, COVID-19, flu A, flu B or viral URI.  Patient resulted negative for strep, COVID, flu A and B.  His symptoms likely exposure to dust or viral URI.  I have discussed the lab results with the patient.   Patient in no distress. Detailed discussions were had with the patient regarding current findings, and need for close f/u with PCP or on call doctor. The patient has been instructed to return immediately if the symptoms worsen in any way for re-evaluation. Patient verbalized understanding and is in agreement with current care plan. All questions answered prior to discharge.           Final Clinical Impression(s) / ED Diagnoses Final diagnoses:  Acute viral pharyngitis    Rx / DC Orders ED Discharge Orders     None         Teola Bradley, MD 05/25/22 1747    Drenda Freeze, MD 05/25/22 2203

## 2022-09-05 ENCOUNTER — Encounter (HOSPITAL_COMMUNITY): Payer: Self-pay | Admitting: Emergency Medicine

## 2022-09-05 ENCOUNTER — Ambulatory Visit (HOSPITAL_COMMUNITY)
Admission: EM | Admit: 2022-09-05 | Discharge: 2022-09-05 | Disposition: A | Discharge status: Home / Self Care | Payer: 59 | Attending: Internal Medicine | Admitting: Internal Medicine

## 2022-09-05 DIAGNOSIS — J069 Acute upper respiratory infection, unspecified: Secondary | ICD-10-CM

## 2022-09-05 DIAGNOSIS — J029 Acute pharyngitis, unspecified: Secondary | ICD-10-CM

## 2022-09-05 MED ORDER — PROMETHAZINE-DM 6.25-15 MG/5ML PO SYRP: 5.0000 mL | ORAL_SOLUTION | Freq: Every evening | ORAL | 0 refills | Status: DC | PRN

## 2022-09-05 MED ORDER — BENZONATATE 100 MG PO CAPS: 100.0000 mg | ORAL_CAPSULE | Freq: Three times a day (TID) | ORAL | 0 refills | Status: DC

## 2022-09-05 NOTE — ED Triage Notes (Signed)
Since last week having fevers and cough. Tried, tea, honey, lemon, mucinex. Pain when coughing

## 2022-09-05 NOTE — Discharge Instructions (Signed)
You have a viral upper respiratory infection.   Purchase mucinex (guaifenesin) 1200mg and take this every 12 hours for the next few days to thin your nasal congestion and mucous so that you are able to get out of your body easier by coughing and blowing your nose. Drink plenty of water while taking this.  Take tessalon pearles every 8 hours as needed for cough.  Take Promethazine DM cough medication to help with your cough at nighttime so that you are able to sleep. Do not drive, drink alcohol, or go to work while taking this medication since it can make you sleepy. Only take this at nighttime.   You may take tylenol 1,000mg and ibuprofen 600mg every 6 hours with food as needed for fever/chills, sore throat, aches/pains, and inflammation associated with viral illness. Take this with food to avoid stomach upset.    You may do salt water and baking soda gargles every 4 hours as needed for your throat pain.  Please put 1 teaspoon of salt and 1/2 teaspoon of baking soda in 8 ounces of warm water then gargle and spit the water out. You may also put 1 tablespoon of honey in warm water and drink this to soothe your throat.  Place a humidifier in your room at night to help decrease dry air that can irritate your airway and cause you to have a sore throat and cough.  Please try to eat a well-balanced diet while you are sick so that your body gets proper nutrition to heal.  If you develop any new or worsening symptoms, please return.  If your symptoms are severe, please go to the emergency room.  Follow-up with your primary care provider for further evaluation and management of your symptoms as well as ongoing wellness visits.  I hope you feel better!    

## 2022-09-05 NOTE — ED Provider Notes (Signed)
[facility]    CSN: [id number] Arrival date & time: 09/05/22  1129      History   Chief Complaint Chief Complaint  Patient presents with   Cough    HPI Stephen Harrison is a 46 y.o. male.   Patient presents urgent care for evaluation of cough, nasal congestion, sore throat, fever/chills, and bodyaches that started on August 31, 2022 (5 days ago).  No known sick contacts with similar symptoms.  He reports generalized headache but denies blurry vision, dizziness, and recent falls/head injuries.  Nasal congestion is thick and makes it difficult for him to breathe through his nose.  He denies chest pain, shortness of breath, weakness, nausea, vomiting, abdominal pain, constipation, diarrhea, rash, and decreased oral intake.  Tolerating food and fluids well without difficulty.  Mild sore throat reported.  He is not a smoker and denies drug use.  Denies history of chronic respiratory problems.  States he felt very very hot and then very very cold over the last couple of days and believes he has been running a high fever.  He has not taken his temperature at home due to lack of thermometer.  He has not attempted use of any over-the-counter medicines to try to help with his symptoms prior to arrival urgent care.   Cough   History reviewed. No pertinent past medical history.  There are no problems to display for this patient.   History reviewed. No pertinent surgical history.     Home Medications    Prior to Admission medications   Medication Sig Start Date End Date Taking? Authorizing Provider  benzonatate (TESSALON) 100 MG capsule Take 1 capsule (100 mg total) by mouth every 8 (eight) hours. 09/05/22  Yes Carlisle Beers, FNP  promethazine-dextromethorphan (PROMETHAZINE-DM) 6.25-15 MG/5ML syrup Take 5 mLs by mouth at bedtime as needed for cough. 09/05/22  Yes Carlisle Beers, FNP  aspirin EC 81 MG tablet Take 162-324 mg by mouth as needed (for headaches or  mild pain).    [provider]  bacitracin ointment Apply 1 application topically 2 (two) times daily. 04/15/19   Jeannie Fend, PA-C  doxycycline (VIBRAMYCIN) 100 MG capsule Take 1 capsule (100 mg total) by mouth 2 (two) times daily. 04/24/19   Mickie Bail, NP  hydrocortisone cream 1 % Apply to affected area 2 times daily 11/20/19   Lorelee New, PA-C  naproxen (NAPROSYN) 500 MG tablet Take 1 tablet (500 mg total) by mouth 2 (two) times daily. 06/23/21   Henderly, Britni A, PA-C  silver sulfADIAZINE (SILVADENE) 1 % cream Apply 1 application topically daily. 05/27/19   Myra Rude, MD  traMADol (ULTRAM) 50 MG tablet Take 1 tablet (50 mg total) by mouth every 6 (six) hours as needed. 04/20/19   Janace Aris, NP    Family History Family History  Problem Relation Age of Onset   Cancer Mother    Alcohol abuse Father     Social History Social History   Tobacco Use   Smoking status: Never   Smokeless tobacco: Never  Vaping Use   Vaping Use: Never used  Substance Use Topics   Alcohol use: Never   Drug use: Never     Allergies   Patient has no known allergies.   Review of Systems Review of Systems  Respiratory:  Positive for cough.   Per HPI   Physical Exam Triage Vital Signs ED Triage Vitals  Enc Vitals Group     BP  09/05/22 1345 124/83     Pulse Rate 09/05/22 1345 75     Resp 09/05/22 1345 16     Temp 09/05/22 1345 98.4 F (36.9 C)     Temp Source 09/05/22 1345 Oral     SpO2 09/05/22 1345 96 %     Weight --      Height --      Head Circumference --      Peak Flow --      Pain Score 09/05/22 1343 0     Pain Loc --      Pain Edu? --      Excl. in GC? --    No data found.  Updated Vital Signs BP 124/83 (BP Location: Right Arm)   Pulse 75   Temp 98.4 F (36.9 C) (Oral)   Resp 16   SpO2 96%   Visual Acuity Right Eye Distance:   Left Eye Distance:   Bilateral Distance:    Right Eye Near:   Left Eye Near:    Bilateral Near:      Physical Exam Vitals and nursing note reviewed.  Constitutional:      Appearance: He is not ill-appearing or toxic-appearing.  HENT:     Head: Normocephalic and atraumatic.     Right Ear: Hearing, tympanic membrane, ear canal and external ear normal.     Left Ear: Hearing, tympanic membrane, ear canal and external ear normal.     Nose: Congestion present.     Mouth/Throat:     Lips: Pink.     Pharynx: Posterior oropharyngeal erythema present.  Eyes:     General: Lids are normal. Vision grossly intact. Gaze aligned appropriately.        Left eye: No discharge.     Extraocular Movements: Extraocular movements intact.     Conjunctiva/sclera: Conjunctivae normal.  Cardiovascular:     Rate and Rhythm: Normal rate and regular rhythm.     Heart sounds: Normal heart sounds, S1 normal and S2 normal.  Pulmonary:     Effort: Pulmonary effort is normal. No respiratory distress.     Breath sounds: Normal breath sounds and air entry.  Musculoskeletal:     Cervical back: Neck supple.     Right lower leg: No edema.     Left lower leg: No edema.  Lymphadenopathy:     Cervical: Cervical adenopathy present.  Skin:    General: Skin is warm and dry.     Capillary Refill: Capillary refill takes less than 2 seconds.     Findings: No rash.  Neurological:     General: No focal deficit present.     Mental Status: He is alert and oriented to person, place, and time. Mental status is at baseline.     Cranial Nerves: No dysarthria or facial asymmetry.     Motor: No weakness.     Gait: Gait normal.  Psychiatric:        Mood and Affect: Mood normal.        Speech: Speech normal.        Behavior: Behavior normal.        Thought Content: Thought content normal.        Judgment: Judgment normal.      UC Treatments / Results  Labs (all labs ordered are listed, but only abnormal results are displayed) Labs Reviewed - No data to display  EKG   Radiology No results  found.  Procedures Procedures (including critical care time)  Medications Ordered  in UC Medications - No data to display  Initial Impression / Assessment and Plan / UC Course  I have reviewed the triage vital signs and the nursing notes.  Pertinent labs & imaging results that were available during my care of the patient were reviewed by me and considered in my medical decision making (see chart for details).   Viral URI with cough Symptoms and physical exam consistent with a viral upper respiratory tract infection that will likely resolve with rest, fluids, and prescriptions for symptomatic relief. No indication for imaging today based on stable cardiopulmonary exam and hemodynamically stable vital signs.  Deferred viral testing based on timing of symptoms.  Tessalon Perles and Promethazine DM sent to pharmacy for symptomatic relief to be taken as prescribed.  May purchase Fenesin over-the-counter and take this as directed for nasal congestion and cough. Promethazine DM cough medication may be used as needed only at bedtime due to possible drowsiness side effect (no alcohol, working, or driving while taking this advised).  May use ibuprofen/tylenol over the counter for body aches, fever/chills, and overall discomfort associated with viral illness. Nonpharmacologic interventions for symptom relief provided and after visit summary below.   Strict ED/urgent care return precautions given.  Patient verbalizes understanding and agreement with plan.  Counseled patient regarding possible side effects and uses of all medications prescribed at today's visit.  Patient verbalizes understanding and agreement with plan.  All questions answered.  Patient discharged from urgent care in stable condition.        Final Clinical Impressions(s) / UC Diagnoses   Final diagnoses:  Viral URI with cough  Sore throat     Discharge Instructions      You have a viral upper respiratory infection.   Purchase  mucinex (guaifenesin) 1200mg  and take this every 12 hours for the next few days to thin your nasal congestion and mucous so that you are able to get out of your body easier by coughing and blowing your nose. Drink plenty of water while taking this.  Take tessalon pearles every 8 hours as needed for cough.  Take Promethazine DM cough medication to help with your cough at nighttime so that you are able to sleep. Do not drive, drink alcohol, or go to work while taking this medication since it can make you sleepy. Only take this at nighttime.   You may take tylenol 1,000mg  and ibuprofen 600mg  every 6 hours with food as needed for fever/chills, sore throat, aches/pains, and inflammation associated with viral illness. Take this with food to avoid stomach upset.    You may do salt water and baking soda gargles every 4 hours as needed for your throat pain.  Please put 1 teaspoon of salt and 1/2 teaspoon of baking soda in 8 ounces of warm water then gargle and spit the water out. You may also put 1 tablespoon of honey in warm water and drink this to soothe your throat.  Place a humidifier in your room at night to help decrease dry air that can irritate your airway and cause you to have a sore throat and cough.  Please try to eat a well-balanced diet while you are sick so that your body gets proper nutrition to heal.  If you develop any new or worsening symptoms, please return.  If your symptoms are severe, please go to the emergency room.  Follow-up with your primary care provider for further evaluation and management of your symptoms as well as ongoing wellness visits.  I  hope you feel better!     ED Prescriptions     Medication Sig Dispense Auth. Provider   benzonatate (TESSALON) 100 MG capsule Take 1 capsule (100 mg total) by mouth every 8 (eight) hours. 21 capsule Carlisle BeersStanhope, Makenize Messman M, FNP   promethazine-dextromethorphan (PROMETHAZINE-DM) 6.25-15 MG/5ML syrup Take 5 mLs by mouth at bedtime as needed for  cough. 118 mL Carlisle BeersStanhope, Nyashia Raney M, FNP      PDMP not reviewed this encounter.   Carlisle BeersStanhope, Electra Paladino M, [place]NP 09/05/22 1423

## 2022-10-10 ENCOUNTER — Emergency Department (HOSPITAL_COMMUNITY)
Admission: EM | Admit: 2022-10-10 | Discharge: 2022-10-10 | Disposition: A | Discharge status: Home / Self Care | Payer: BC Managed Care – PPO | Attending: Emergency Medicine | Admitting: Emergency Medicine

## 2022-10-10 ENCOUNTER — Emergency Department (HOSPITAL_COMMUNITY): Payer: BC Managed Care – PPO

## 2022-10-10 ENCOUNTER — Other Ambulatory Visit: Payer: Self-pay

## 2022-10-10 ENCOUNTER — Encounter (HOSPITAL_COMMUNITY): Payer: Self-pay

## 2022-10-10 DIAGNOSIS — S301XXA Contusion of abdominal wall, initial encounter: Secondary | ICD-10-CM | POA: Diagnosis not present

## 2022-10-10 DIAGNOSIS — Y92410 Unspecified street and highway as the place of occurrence of the external cause: Secondary | ICD-10-CM | POA: Insufficient documentation

## 2022-10-10 DIAGNOSIS — S20211A Contusion of right front wall of thorax, initial encounter: Secondary | ICD-10-CM | POA: Diagnosis not present

## 2022-10-10 DIAGNOSIS — M940 Chondrocostal junction syndrome [Tietze]: Secondary | ICD-10-CM

## 2022-10-10 DIAGNOSIS — Z7982 Long term (current) use of aspirin: Secondary | ICD-10-CM | POA: Insufficient documentation

## 2022-10-10 DIAGNOSIS — R0789 Other chest pain: Secondary | ICD-10-CM | POA: Diagnosis present

## 2022-10-10 DIAGNOSIS — M542 Cervicalgia: Secondary | ICD-10-CM | POA: Diagnosis not present

## 2022-10-10 DIAGNOSIS — R519 Headache, unspecified: Secondary | ICD-10-CM | POA: Insufficient documentation

## 2022-10-10 LAB — URINALYSIS, ROUTINE W REFLEX MICROSCOPIC
Bilirubin Urine: NEGATIVE
Glucose, UA: NEGATIVE mg/dL
Hgb urine dipstick: NEGATIVE
Ketones, ur: NEGATIVE mg/dL
Leukocytes,Ua: NEGATIVE
Nitrite: NEGATIVE
Protein, ur: NEGATIVE mg/dL
Specific Gravity, Urine: 1.014 (ref 1.005–1.030)
pH: 6 (ref 5.0–8.0)

## 2022-10-10 LAB — CBC
HCT: 40.8 % (ref 39.0–52.0)
Hemoglobin: 13.5 g/dL (ref 13.0–17.0)
MCH: 29.4 pg (ref 26.0–34.0)
MCHC: 33.1 g/dL (ref 30.0–36.0)
MCV: 88.9 fL (ref 80.0–100.0)
Platelets: 214 10*3/uL (ref 150–400)
RBC: 4.59 MIL/uL (ref 4.22–5.81)
RDW: 13.4 % (ref 11.5–15.5)
WBC: 5.5 10*3/uL (ref 4.0–10.5)
nRBC: 0 % (ref 0.0–0.2)

## 2022-10-10 LAB — COMPREHENSIVE METABOLIC PANEL
ALT: 41 U/L (ref 0–44)
AST: 29 U/L (ref 15–41)
Albumin: 3.5 g/dL (ref 3.5–5.0)
Alkaline Phosphatase: 46 U/L (ref 38–126)
Anion gap: 4 — ABNORMAL LOW (ref 5–15)
BUN: 5 mg/dL — ABNORMAL LOW (ref 6–20)
CO2: 28 mmol/L (ref 22–32)
Calcium: 8.6 mg/dL — ABNORMAL LOW (ref 8.9–10.3)
Chloride: 105 mmol/L (ref 98–111)
Creatinine, Ser: 1.01 mg/dL (ref 0.61–1.24)
GFR, Estimated: >60 mL/min (ref >60)
Glucose, Bld: 111 mg/dL — ABNORMAL HIGH (ref 70–99)
Potassium: 3.5 mmol/L (ref 3.5–5.1)
Sodium: 137 mmol/L (ref 135–145)
Total Bilirubin: 0.4 mg/dL (ref 0.3–1.2)
Total Protein: 6 g/dL — ABNORMAL LOW (ref 6.5–8.1)

## 2022-10-10 LAB — I-STAT CHEM 8, ED
BUN: 11 mg/dL (ref 6–20)
Calcium, Ion: 1.1 mmol/L — ABNORMAL LOW (ref 1.15–1.40)
Chloride: 105 mmol/L (ref 98–111)
Creatinine, Ser: 1.1 mg/dL (ref 0.61–1.24)
Glucose, Bld: 108 mg/dL — ABNORMAL HIGH (ref 70–99)
HCT: 44 % (ref 39.0–52.0)
Hemoglobin: 15 g/dL (ref 13.0–17.0)
Potassium: 6.6 mmol/L (ref 3.5–5.1)
Sodium: 139 mmol/L (ref 135–145)
TCO2: 29 mmol/L (ref 22–32)

## 2022-10-10 LAB — SAMPLE TO BLOOD BANK

## 2022-10-10 LAB — LACTIC ACID, PLASMA: Lactic Acid, Venous: 1.2 mmol/L (ref 0.5–1.9)

## 2022-10-10 LAB — ETHANOL: Alcohol, Ethyl (B): <10 mg/dL (ref <10)

## 2022-10-10 MED ORDER — ONDANSETRON HCL 4 MG/2ML IJ SOLN
4.0000 mg | Freq: Once | INTRAMUSCULAR | Status: AC
  Administered 2022-10-10: 4 mg via INTRAVENOUS
  Filled 2022-10-10: qty 2

## 2022-10-10 MED ORDER — OXYCODONE HCL 5 MG PO TABS: 5.0000 mg | ORAL_TABLET | Freq: Four times a day (QID) | ORAL | 0 refills | Status: AC | PRN

## 2022-10-10 MED ORDER — MORPHINE SULFATE (PF) 4 MG/ML IV SOLN
4.0000 mg | Freq: Once | INTRAVENOUS | Status: AC
  Administered 2022-10-10: 4 mg via INTRAVENOUS
  Filled 2022-10-10: qty 1

## 2022-10-10 MED ORDER — IBUPROFEN 800 MG PO TABS: 800.0000 mg | ORAL_TABLET | Freq: Three times a day (TID) | ORAL | 0 refills | Status: AC | PRN

## 2022-10-10 MED ORDER — IOHEXOL 350 MG/ML SOLN
75.0000 mL | Freq: Once | INTRAVENOUS | Status: AC | PRN
  Administered 2022-10-10: 75 mL via INTRAVENOUS
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
## 2022-10-10 NOTE — ED Notes (Signed)
Patient transported to CT 
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
## 2022-10-10 NOTE — ED Notes (Signed)
CMP recollected and sent to lab.  No new orders at this time.

## 2022-10-22 ENCOUNTER — Other Ambulatory Visit: Payer: Self-pay

## 2022-10-22 ENCOUNTER — Encounter (HOSPITAL_COMMUNITY): Payer: Self-pay

## 2022-10-22 ENCOUNTER — Emergency Department (HOSPITAL_COMMUNITY): Payer: No Typology Code available for payment source

## 2022-10-22 ENCOUNTER — Emergency Department (HOSPITAL_COMMUNITY)
Admission: EM | Admit: 2022-10-22 | Discharge: 2022-10-22 | Disposition: A | Discharge status: Home / Self Care | Payer: No Typology Code available for payment source | Attending: Emergency Medicine | Admitting: Emergency Medicine

## 2022-10-22 DIAGNOSIS — Y92410 Unspecified street and highway as the place of occurrence of the external cause: Secondary | ICD-10-CM | POA: Diagnosis not present

## 2022-10-22 DIAGNOSIS — R079 Chest pain, unspecified: Secondary | ICD-10-CM | POA: Insufficient documentation

## 2022-10-22 DIAGNOSIS — R519 Headache, unspecified: Secondary | ICD-10-CM | POA: Insufficient documentation
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
## 2022-10-22 NOTE — ED Notes (Signed)
Patient transported to X-ray 
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
## 2023-09-25 ENCOUNTER — Ambulatory Visit: Payer: Self-pay

## 2023-09-25 ENCOUNTER — Other Ambulatory Visit: Payer: Self-pay | Admitting: Family Medicine

## 2023-09-25 DIAGNOSIS — Z021 Encounter for pre-employment examination: Secondary | ICD-10-CM

## 2023-12-20 ENCOUNTER — Other Ambulatory Visit: Payer: Self-pay

## 2023-12-20 ENCOUNTER — Emergency Department (HOSPITAL_COMMUNITY)
Admission: EM | Admit: 2023-12-20 | Discharge: 2023-12-20 | Disposition: A | Discharge status: Home / Self Care | Payer: Self-pay | Attending: Emergency Medicine | Admitting: Emergency Medicine

## 2023-12-20 DIAGNOSIS — M545 Low back pain, unspecified: Secondary | ICD-10-CM

## 2023-12-20 DIAGNOSIS — S39012A Strain of muscle, fascia and tendon of lower back, initial encounter: Secondary | ICD-10-CM

## 2023-12-20 DIAGNOSIS — Y990 Civilian activity done for income or pay: Secondary | ICD-10-CM | POA: Insufficient documentation

## 2023-12-20 DIAGNOSIS — X509XXA Other and unspecified overexertion or strenuous movements or postures, initial encounter: Secondary | ICD-10-CM | POA: Insufficient documentation

## 2023-12-20 LAB — CBC WITH DIFFERENTIAL/PLATELET
Abs Immature Granulocytes: 0.01 10*3/uL (ref 0.00–0.07)
Basophils Absolute: 0 10*3/uL (ref 0.0–0.1)
Basophils Relative: 0 %
Eosinophils Absolute: 0 10*3/uL (ref 0.0–0.5)
Eosinophils Relative: 1 %
HCT: 42.4 % (ref 39.0–52.0)
Hemoglobin: 13.7 g/dL (ref 13.0–17.0)
Immature Granulocytes: 0 %
Lymphocytes Relative: 40 %
Lymphs Abs: 1.9 10*3/uL (ref 0.7–4.0)
MCH: 29.3 pg (ref 26.0–34.0)
MCHC: 32.3 g/dL (ref 30.0–36.0)
MCV: 90.6 fL (ref 80.0–100.0)
Monocytes Absolute: 0.4 10*3/uL (ref 0.1–1.0)
Monocytes Relative: 8 %
Neutro Abs: 2.4 10*3/uL (ref 1.7–7.7)
Neutrophils Relative %: 51 %
Platelets: 209 10*3/uL (ref 150–400)
RBC: 4.68 MIL/uL (ref 4.22–5.81)
RDW: 13.1 % (ref 11.5–15.5)
WBC: 4.7 10*3/uL (ref 4.0–10.5)
nRBC: 0 % (ref 0.0–0.2)

## 2023-12-20 LAB — BASIC METABOLIC PANEL WITH GFR
Anion gap: 9 (ref 5–15)
BUN: 10 mg/dL (ref 6–20)
CO2: 28 mmol/L (ref 22–32)
Calcium: 9.2 mg/dL (ref 8.9–10.3)
Chloride: 102 mmol/L (ref 98–111)
Creatinine, Ser: 0.91 mg/dL (ref 0.61–1.24)
GFR, Estimated: >60 mL/min (ref >60)
Glucose, Bld: 92 mg/dL (ref 70–99)
Potassium: 3.4 mmol/L — ABNORMAL LOW (ref 3.5–5.1)
Sodium: 139 mmol/L (ref 135–145)

## 2023-12-20 LAB — URINALYSIS, ROUTINE W REFLEX MICROSCOPIC
Bilirubin Urine: NEGATIVE
Glucose, UA: NEGATIVE mg/dL
Hgb urine dipstick: NEGATIVE
Ketones, ur: 20 mg/dL — AB
Leukocytes,Ua: NEGATIVE
Nitrite: NEGATIVE
Protein, ur: NEGATIVE mg/dL
Specific Gravity, Urine: 1.015 (ref 1.005–1.030)
pH: 6 (ref 5.0–8.0)

## 2023-12-20 MED ORDER — LIDOCAINE 5 % EX PTCH
2.0000 | MEDICATED_PATCH | CUTANEOUS | Status: DC
  Administered 2023-12-20: 2 via TRANSDERMAL
  Filled 2023-12-20: qty 2

## 2023-12-20 MED ORDER — METHOCARBAMOL 500 MG PO TABS: 500.0000 mg | ORAL_TABLET | Freq: Two times a day (BID) | ORAL | 0 refills | Status: AC

## 2023-12-20 MED ORDER — NAPROXEN 500 MG PO TABS
500.0000 mg | ORAL_TABLET | Freq: Once | ORAL | Status: AC
  Administered 2023-12-20: 500 mg via ORAL
  Filled 2023-12-20: qty 1

## 2023-12-20 MED ORDER — NAPROXEN 375 MG PO TABS: 375.0000 mg | ORAL_TABLET | Freq: Two times a day (BID) | ORAL | 0 refills | Status: AC

## 2023-12-20 MED ORDER — METHOCARBAMOL 500 MG PO TABS
1000.0000 mg | ORAL_TABLET | Freq: Once | ORAL | Status: AC
  Administered 2023-12-20: 1000 mg via ORAL
  Filled 2023-12-20: qty 2

## 2023-12-20 NOTE — ED Triage Notes (Signed)
 Pt to ED from POV. Pt c/o lower back pain for 1 week. Pt reports working hard at work and potentially pulling a muscle.

## 2023-12-20 NOTE — Discharge Instructions (Addendum)
 Thank you for letting us  evaluate you today.  It appears that you have may have strained your back.  I provided you with naproxen and Robaxin for your symptoms.  This is a strong anti-inflammatory and muscle relaxer. You may use naproxen and Tylenol intermittently every 8 hours as needed for pain.  Please do not use naproxen with aspirin, Aleve, ibuprofen, Advil as they are all in the same family. Robaxin may cause drowsiness so do not operate heavy machinery including driving or drink alcohol with this.  You may take this at night or split the tablet in half if it makes you too drowsy.  You may also take over-the-counter lidocaine patches, Voltaren gel to apply to areas of pain. You may also put ice or heat on areas of pain. I suggest that you try to not do as much physically demanding work at work.  All labs look great! Urine withou infection nor blood  **AS we have given you a dose of meds here in ED you cannot take your prescriptions until tomorrow morning  Please make sure to follow-up with your primary care provider for further management. Return to ED if you experience urinary incontinence, loss of sensation in genital region, significant worsening of back pain, numbness or weakness on one side of your body

## 2023-12-20 NOTE — ED Provider Notes (Signed)
 [facility] EMERGENCY DEPARTMENT AT [facility] Provider Note   CSN: [id number] Arrival date & time: 12/20/23  1935     History {Add pertinent medical, surgical, social history, OB history to HPI:1} Chief Complaint  Patient presents with   Back Pain    Stephen Harrison is a 48 y.o. male with no pertinent past medical history presents to emergency department for evaluation of bilateral lower lumbar back pain that started today.  He reports that he initially had right-sided lumbar back pain that started on Wednesday with pain with bending over that was resolved with ibuprofen. He has never had any numbness, nor weakness of BLE. 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  He denies IVDU, urinary symptoms, incontinence, saddle paresthesia, no malignancy, history of stone.   Back Pain Associated symptoms: no abdominal pain, no chest pain, no fever, no headaches, no numbness and no weakness       Home Medications Prior to Admission medications   Medication Sig Start Date End Date Taking? Authorizing Provider  ibuprofen (ADVIL) 800 MG tablet Take 1 tablet (800 mg total) by mouth every 8 (eight) hours as needed for moderate pain. 10/10/22   Quinn Bucco, DO      Allergies    Patient has no known allergies.    Review of Systems   Review of Systems  Constitutional:  Negative for chills, fatigue and fever.  Respiratory:  Negative for cough, chest tightness, shortness of breath and wheezing.   Cardiovascular:  Negative for chest pain and palpitations.  Gastrointestinal:  Negative for abdominal pain, constipation, diarrhea, nausea and vomiting.  Musculoskeletal:  Positive for back pain.  Neurological:  Negative for dizziness, seizures, weakness, light-headedness, numbness and headaches.    Physical Exam Updated Vital Signs BP (!) 138/97   Pulse 80   Temp 98.2 F (36.8 C) (Oral)   Resp 18   SpO2 97%  Physical Exam Vitals and nursing note  reviewed.  Constitutional:      General: He is not in acute distress.    Appearance: Normal appearance.  HENT:     Head: Normocephalic and atraumatic.  Eyes:     Conjunctiva/sclera: Conjunctivae normal.  Cardiovascular:     Rate and Rhythm: Normal rate.     Pulses:          Dorsalis pedis pulses are 2+ on the right side and 2+ on the left side.  Pulmonary:     Effort: Pulmonary effort is normal. No respiratory distress.  Musculoskeletal:       Arms:     Cervical back: No deformity, rigidity, tenderness or bony tenderness. Normal range of motion.     Thoracic back: No tenderness or bony tenderness. Normal range of motion.     Lumbar back: Tenderness present. No bony tenderness. Normal range of motion. Negative right straight leg raise test and negative left straight leg raise test.  Skin:    Coloration: Skin is not jaundiced or pale.  Neurological:     General: No focal deficit present.     Mental Status: He is alert and oriented to person, place, and time. Mental status is at baseline.     GCS: GCS eye subscore is 4. GCS verbal subscore is 5. GCS motor subscore is 6.     Coordination: Heel to Shin Test normal.     Gait: Gait is intact.     Deep Tendon Reflexes:     Reflex Scores:  Patellar reflexes are 2+ on the right side and 2+ on the left side.    Comments: Follows commands appropriately.  Ambulates without difficulty.  Sensation 2/2 of L2-S1 BLE.  Motor 5/5 of BLE. WB BLE equally    ED Results / Procedures / Treatments   Labs (all labs ordered are listed, but only abnormal results are displayed) Labs Reviewed - No data to display  EKG None  Radiology No results found.  Procedures Procedures  {Document cardiac monitor, telemetry assessment procedure when appropriate:1}  Medications Ordered in ED Medications  lidocaine (LIDODERM) 5 % 2 patch (2 patches Transdermal Patch Applied 12/20/23 2151)  naproxen (NAPROSYN) tablet 500 mg (500 mg Oral Given 12/20/23 2149)   methocarbamol (ROBAXIN) tablet 1,000 mg (1,000 mg Oral Given 12/20/23 2149)    ED Course/ Medical Decision Making/ A&P   {   Click here for ABCD2, HEART and other calculatorsREFRESH Note before signing :1}                              Medical Decision Making Risk Prescription drug management.   Patient presents to the ED for concern of lower back pain, this involves an extensive number of treatment options, and is a complaint that carries with it a high risk of complications and morbidity.  The differential diagnosis includes muscle strain, DDD, cauda equina, nephrolithiasis, epidural abscess   Co morbidities that complicate the patient evaluation  None   Additional history obtained:  Additional history obtained from Nursing   External records from outside source obtained and reviewed including triage RN note    Medicines ordered and prescription drug management:  I ordered medication including naproxen, Robaxin, lidocaine patches for muscle strain Reevaluation of the patient after these medicines showed that the patient improved I have reviewed the patients home medicines and have made adjustments as needed   Test Considered:  X-ray lumbar spine     Problem List / ED Course:  Acute bilateral lower back pain without sciatica Strain of lumbar region No red flag symptoms.  No fever in ED.  No risk factors -low suspicion for emergent cause of back pain to include epidural abscess, cauda equina, nephrolithiasis, infected kidney stone Well perfused legs bilaterally and DP 2+ bilaterally.  Low suspicion for kidney stone with no urinary symptoms. Shared decision making is had with patient regarding obtaining imaging. He does not wish to pursue at this time. Will wait for labs and if grossly abnormal will pursue Will treat as a muscle strain as he is tender in the paraspinous regions with Robaxin, lidocaine patches, naproxen.  He does have a cousin that is driving him  home Will also provide prescription for Robaxin and naproxen.  Discussed cautions with these drugs and interactions.  He expresses understanding Discussed with patient to follow-up with his primary care provider if pain persists and for further management Encouraged patient to do light duty at work however patient reports that he does not have light duty and is unable to take off work.  Did provide 1 day off for work we will work note Provided red flag symptoms, return to emergency department precautions on discharge instructions   Reevaluation:  After the interventions noted above, I reevaluated the patient and found that they have :improved   Social Determinants of Health:  Has PCP at [facility]    Dispostion:  After consideration of the diagnostic results and the patients response to treatment,  I feel that the patent would benefit from outpatient management with symptomatic care.   Discussed ED workup, disposition, return to ED precautions with patient who expresses understanding agrees with plan.  All questions answered to their satisfaction.  They are agreeable to plan.  Discharge instructions provided on paperwork  Final Clinical Impression(s) / ED Diagnoses Final diagnoses:  Acute bilateral low back pain without sciatica  Strain of lumbar region, initial encounter    Rx / DC Orders ED Discharge Orders     None
